# Patient Record
Sex: Male | Born: 1945 | Race: Black or African American | Hispanic: No | Marital: Single | Smoking: Former smoker
Health system: Southern US, Community
[De-identification: ages and names within clinical notes are randomized; demographics above are authoritative.]

## PROBLEM LIST (undated history)

## (undated) DIAGNOSIS — K219 Gastro-esophageal reflux disease without esophagitis: Secondary | ICD-10-CM

## (undated) DIAGNOSIS — R51 Headache: Secondary | ICD-10-CM

## (undated) DIAGNOSIS — N400 Enlarged prostate without lower urinary tract symptoms: Secondary | ICD-10-CM

## (undated) DIAGNOSIS — R569 Unspecified convulsions: Secondary | ICD-10-CM

## (undated) DIAGNOSIS — I10 Essential (primary) hypertension: Secondary | ICD-10-CM

## (undated) DIAGNOSIS — K8010 Calculus of gallbladder with chronic cholecystitis without obstruction: Secondary | ICD-10-CM

## (undated) DIAGNOSIS — F79 Unspecified intellectual disabilities: Secondary | ICD-10-CM

## (undated) DIAGNOSIS — N12 Tubulo-interstitial nephritis, not specified as acute or chronic: Secondary | ICD-10-CM

## (undated) HISTORY — DX: Gastro-esophageal reflux disease without esophagitis: K21.9

## (undated) HISTORY — DX: Benign prostatic hyperplasia without lower urinary tract symptoms: N40.0

## (undated) HISTORY — DX: Essential (primary) hypertension: I10

## (undated) HISTORY — DX: Unspecified intellectual disabilities: F79

## (undated) HISTORY — DX: Unspecified convulsions: R56.9

## (undated) HISTORY — DX: Calculus of gallbladder with chronic cholecystitis without obstruction: K80.10

## (undated) HISTORY — DX: Tubulo-interstitial nephritis, not specified as acute or chronic: N12

## (undated) HISTORY — PX: CATARACT EXTRACTION: SUR2

## (undated) HISTORY — DX: Headache: R51

---

## 2004-10-31 ENCOUNTER — Ambulatory Visit: Payer: Self-pay | Admitting: Ophthalmology

## 2004-11-06 ENCOUNTER — Ambulatory Visit: Payer: Self-pay | Admitting: Ophthalmology

## 2006-03-07 ENCOUNTER — Inpatient Hospital Stay: Payer: Self-pay | Admitting: Internal Medicine

## 2006-03-07 ENCOUNTER — Other Ambulatory Visit: Payer: Self-pay

## 2011-10-06 ENCOUNTER — Other Ambulatory Visit: Payer: Self-pay | Admitting: Geriatric Medicine

## 2011-10-06 LAB — URINALYSIS, COMPLETE
Bacteria: NONE SEEN
Blood: NEGATIVE
Glucose,UR: 150 mg/dL (ref 0–75)
Ketone: NEGATIVE
Leukocyte Esterase: NEGATIVE
Nitrite: NEGATIVE
Ph: 7 (ref 4.5–8.0)
Specific Gravity: 1.017 (ref 1.003–1.030)
Squamous Epithelial: NONE SEEN

## 2011-10-07 LAB — URINE CULTURE

## 2011-10-08 ENCOUNTER — Other Ambulatory Visit: Payer: Self-pay

## 2011-10-08 LAB — COMPREHENSIVE METABOLIC PANEL
Albumin: 2.7 g/dL — ABNORMAL LOW (ref 3.4–5.0)
Anion Gap: 11 (ref 7–16)
Calcium, Total: 8.1 mg/dL — ABNORMAL LOW (ref 8.5–10.1)
Chloride: 100 mmol/L (ref 98–107)
Co2: 26 mmol/L (ref 21–32)
EGFR (African American): >60
Glucose: 119 mg/dL — ABNORMAL HIGH (ref 65–99)
Potassium: 4.1 mmol/L (ref 3.5–5.1)
SGOT(AST): 77 U/L — ABNORMAL HIGH (ref 15–37)

## 2011-10-08 LAB — CBC WITH DIFFERENTIAL/PLATELET
Basophil #: 0.1 10*3/uL (ref 0.0–0.1)
Eosinophil #: 0.1 10*3/uL (ref 0.0–0.7)
Eosinophil %: 0.7 %
HGB: 13.2 g/dL (ref 13.0–18.0)
Lymphocyte %: 12.5 %
MCHC: 33.8 g/dL (ref 32.0–36.0)
MCV: 89 fL (ref 80–100)
Monocyte #: 1.2 10*3/uL — ABNORMAL HIGH (ref 0.0–0.7)
Neutrophil #: 8.1 10*3/uL — ABNORMAL HIGH (ref 1.4–6.5)
Neutrophil %: 75.2 %
RDW: 12.8 % (ref 11.5–14.5)
WBC: 10.7 10*3/uL — ABNORMAL HIGH (ref 3.8–10.6)

## 2011-10-09 ENCOUNTER — Other Ambulatory Visit: Payer: Self-pay | Admitting: Geriatric Medicine

## 2011-10-09 LAB — AMYLASE: Amylase: 60 U/L (ref 25–115)

## 2012-07-18 ENCOUNTER — Other Ambulatory Visit: Payer: Self-pay | Admitting: Geriatric Medicine

## 2012-07-18 LAB — URINALYSIS, COMPLETE
Bilirubin,UR: NEGATIVE
Blood: NEGATIVE
Glucose,UR: NEGATIVE mg/dL (ref 0–75)
Hyaline Cast: 4
Ketone: NEGATIVE
Protein: NEGATIVE
RBC,UR: <1 /HPF (ref 0–5)
Squamous Epithelial: NONE SEEN
WBC UR: <1 /HPF (ref 0–5)

## 2012-07-19 LAB — URINE CULTURE

## 2012-07-20 ENCOUNTER — Ambulatory Visit: Payer: Self-pay

## 2012-09-16 DIAGNOSIS — F79 Unspecified intellectual disabilities: Secondary | ICD-10-CM

## 2012-09-16 DIAGNOSIS — I10 Essential (primary) hypertension: Secondary | ICD-10-CM

## 2012-09-16 DIAGNOSIS — N400 Enlarged prostate without lower urinary tract symptoms: Secondary | ICD-10-CM

## 2012-09-16 DIAGNOSIS — K8010 Calculus of gallbladder with chronic cholecystitis without obstruction: Secondary | ICD-10-CM

## 2012-09-16 DIAGNOSIS — R51 Headache: Secondary | ICD-10-CM

## 2012-09-16 HISTORY — DX: Benign prostatic hyperplasia without lower urinary tract symptoms: N40.0

## 2012-09-16 HISTORY — DX: Calculus of gallbladder with chronic cholecystitis without obstruction: K80.10

## 2012-09-16 HISTORY — DX: Essential (primary) hypertension: I10

## 2012-09-16 HISTORY — DX: Headache: R51

## 2012-09-16 HISTORY — DX: Unspecified intellectual disabilities: F79

## 2012-11-23 ENCOUNTER — Ambulatory Visit: Payer: Self-pay | Admitting: General Surgery

## 2012-11-23 DIAGNOSIS — I10 Essential (primary) hypertension: Secondary | ICD-10-CM

## 2012-11-23 LAB — CBC WITH DIFFERENTIAL/PLATELET
Eosinophil #: 0.1 10*3/uL (ref 0.0–0.7)
Eosinophil %: 1.9 %
HCT: 45 % (ref 40.0–52.0)
Lymphocyte #: 1.9 10*3/uL (ref 1.0–3.6)
Lymphocyte %: 35.4 %
MCHC: 32.8 g/dL (ref 32.0–36.0)
MCV: 89 fL (ref 80–100)
Monocyte #: 0.8 x10 3/mm (ref 0.2–1.0)
Monocyte %: 14.7 %
Neutrophil %: 47.4 %
Platelet: 214 10*3/uL (ref 150–440)
RBC: 5.06 10*6/uL (ref 4.40–5.90)

## 2012-11-23 LAB — COMPREHENSIVE METABOLIC PANEL
Alkaline Phosphatase: 65 U/L (ref 50–136)
BUN: 20 mg/dL — ABNORMAL HIGH (ref 7–18)
Bilirubin,Total: 0.5 mg/dL (ref 0.2–1.0)
Calcium, Total: 8.7 mg/dL (ref 8.5–10.1)
Chloride: 107 mmol/L (ref 98–107)
Co2: 25 mmol/L (ref 21–32)
Creatinine: 1.21 mg/dL (ref 0.60–1.30)
EGFR (African American): >60
EGFR (Non-African Amer.): >60
Glucose: 68 mg/dL (ref 65–99)
Potassium: 4.6 mmol/L (ref 3.5–5.1)
SGOT(AST): 18 U/L (ref 15–37)
Sodium: 137 mmol/L (ref 136–145)

## 2012-12-08 ENCOUNTER — Ambulatory Visit: Payer: Self-pay | Admitting: General Surgery

## 2012-12-08 DIAGNOSIS — K8010 Calculus of gallbladder with chronic cholecystitis without obstruction: Secondary | ICD-10-CM

## 2012-12-08 HISTORY — PX: CHOLECYSTECTOMY: SHX55

## 2012-12-09 ENCOUNTER — Encounter: Payer: Self-pay | Admitting: General Surgery

## 2012-12-10 ENCOUNTER — Encounter: Payer: Self-pay | Admitting: *Deleted

## 2012-12-10 DIAGNOSIS — F79 Unspecified intellectual disabilities: Secondary | ICD-10-CM | POA: Insufficient documentation

## 2012-12-14 ENCOUNTER — Encounter: Payer: Self-pay | Admitting: General Surgery

## 2012-12-15 ENCOUNTER — Encounter: Payer: Self-pay | Admitting: General Surgery

## 2012-12-15 ENCOUNTER — Ambulatory Visit (INDEPENDENT_AMBULATORY_CARE_PROVIDER_SITE_OTHER): Payer: Medicare Other | Admitting: General Surgery

## 2012-12-15 VITALS — BP 130/72 | HR 76 | Resp 16 | Ht 68.0 in | Wt 152.0 lb

## 2012-12-15 DIAGNOSIS — Z9049 Acquired absence of other specified parts of digestive tract: Secondary | ICD-10-CM

## 2012-12-15 DIAGNOSIS — Z9089 Acquired absence of other organs: Secondary | ICD-10-CM

## 2012-12-15 NOTE — Patient Instructions (Addendum)
Patient to return as needed. 

## 2012-12-15 NOTE — Progress Notes (Signed)
Subjective:     Patient ID: Scott Peters, male   DOB: 1946-08-10, 67 y.o.   MRN: [id number]  HPI   This is a 67 year old male following up from his post op gallbladder surgery.Patient states no abdominal pain.the patient reports he has had one episode of vomiting since surgery. Cholecystectomy was completed on December 08, 2012. Review of Systems  Constitutional: Negative.   Respiratory: Negative.   Cardiovascular: Negative.   Gastrointestinal: Negative.        Objective:   Physical Exam  Constitutional: He appears well-developed and well-nourished.  Cardiovascular: Normal rate, regular rhythm and normal heart sounds.   Pulmonary/Chest: Effort normal and breath sounds normal.  Abdominal: Soft. Bowel sounds are normal.  Port site healing well.       Assessment:     Pathology showed chronic cholecystitis and cholelithiasis.     Plan:     The patient is Will follow up on an as-needed basis.

## 2012-12-16 ENCOUNTER — Encounter: Payer: Self-pay | Admitting: General Surgery

## 2013-06-01 ENCOUNTER — Ambulatory Visit: Payer: Self-pay | Admitting: Gastroenterology

## 2013-11-02 IMAGING — CR DG CHOLANGIOGRAM OPERATIVE
1 series · 3 of 3 positions shown · non-contrast
Comparison: none

REASON FOR EXAM: Cholelithiasis
COMMENTS:

PROCEDURE:     DXR - DXR CHOLANGIOGRAM OP (INITIAL)  - December 08, 2012  [DATE]
RESULT:     Comparison: None.

[Series 3: cont. · 3 of 3 frames shown]
[frame 1/3]
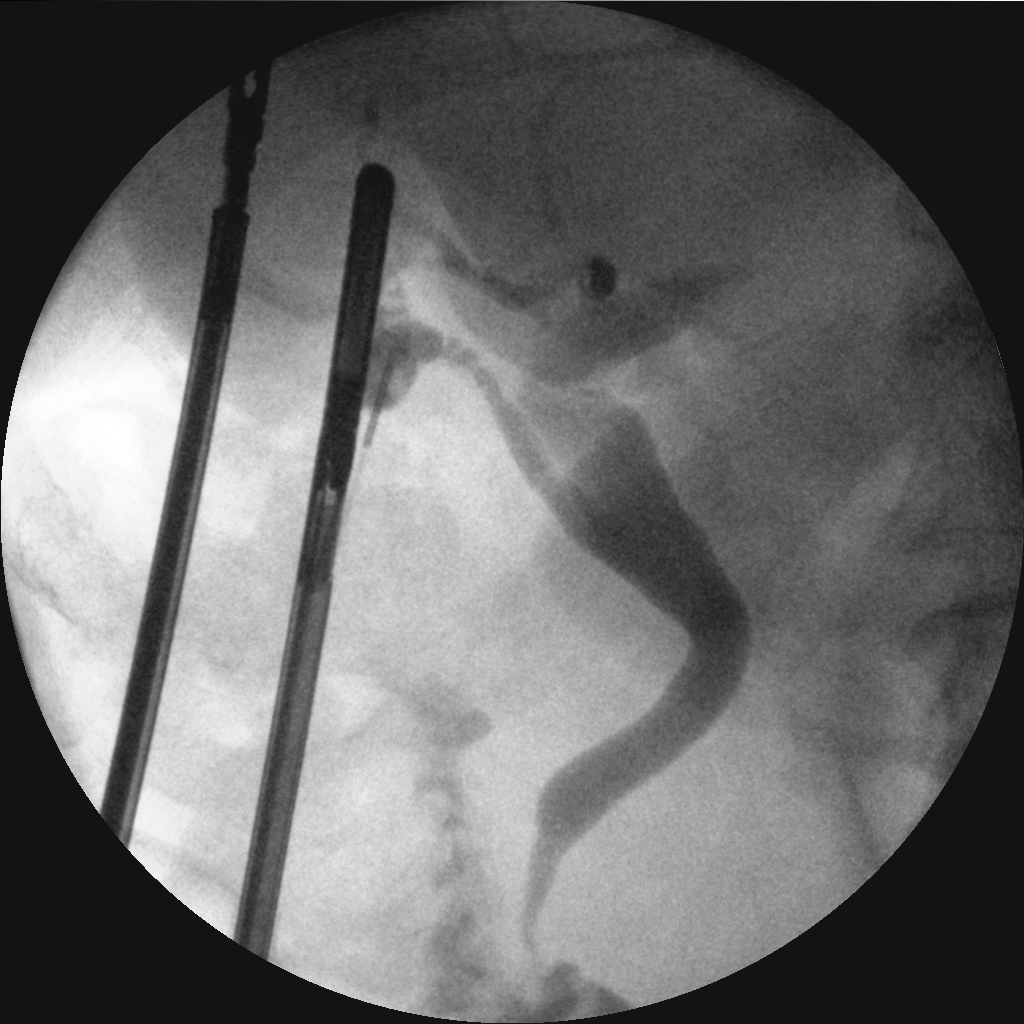
[frame 2/3]
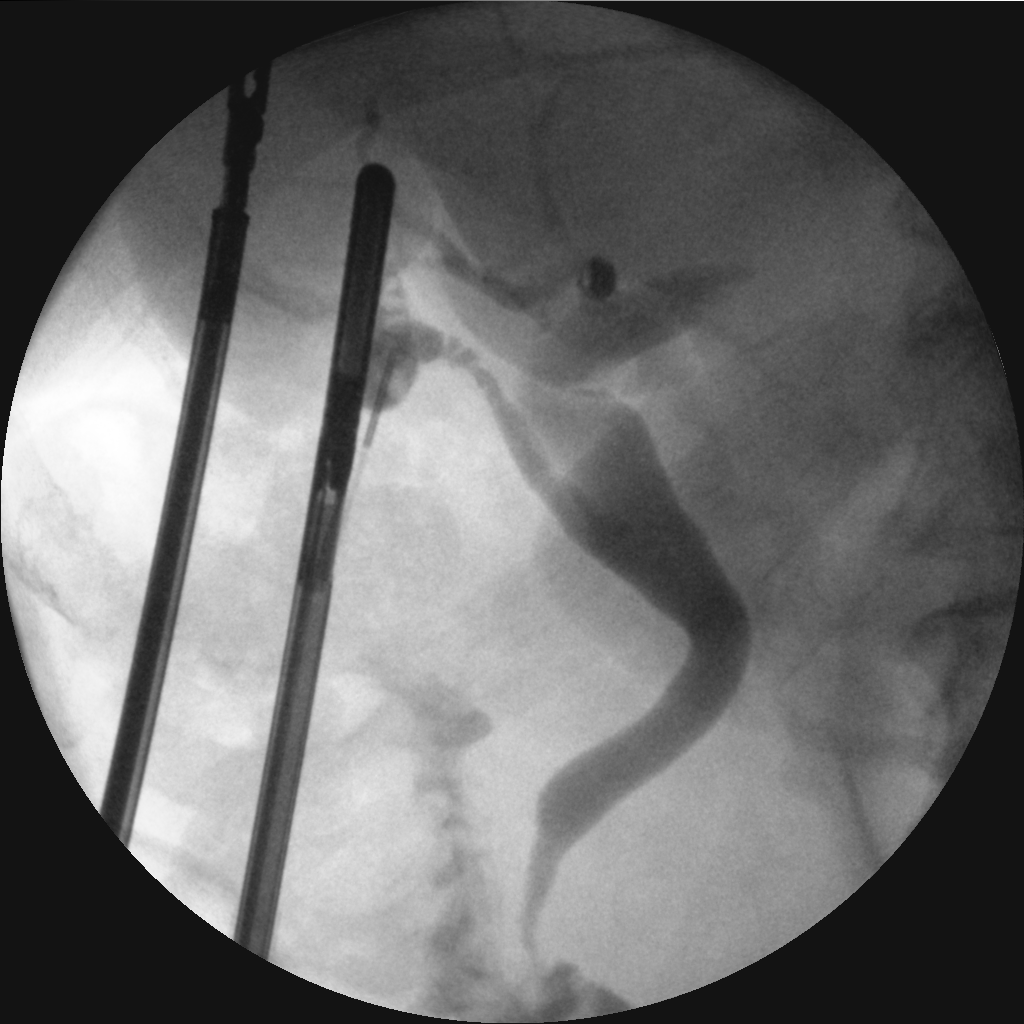
[frame 3/3]
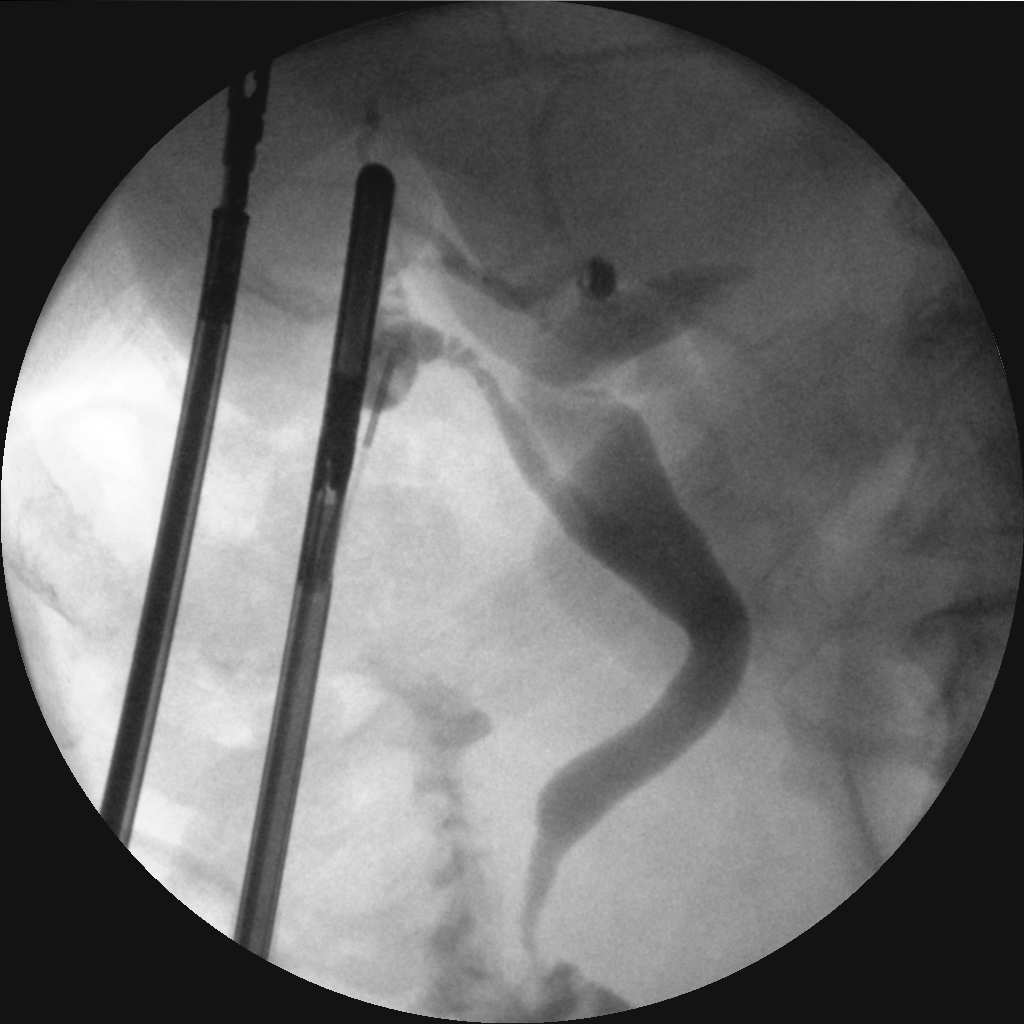

[3 of 3 positions shown; findings below may reference images not displayed]

FINDINGS: Single fluoroscopic image was submitted from intraoperative cholangiogram.
There is opacification of the cystic duct, common bile duct, central
intrahepatic biliary ducts, and duodenum. No filling defects identified.
There is smooth tapering of the distal common bile duct. The common duct is
mildly prominent in diameter. Remainder of the interpretation is deferred to
the performing clinician.
IMPRESSION: Please see above.

[REDACTED]

## 2015-01-06 NOTE — Op Note (Signed)
PATIENT NAME:  Scott Peters, Nhat T MR#:  [id number] DATE OF BIRTH:  01/19/1946  DATE OF PROCEDURE:  12/08/2012  PREOPERATIVE DIAGNOSIS: Chronic cholecystitis and cholelithiasis.   POSTOPERATIVE DIAGNOSIS: Chronic cholecystitis and cholelithiasis.  OPERATIVE PROCEDURE: Laparoscopic cholecystectomy with intraoperative cholangiograms.   OPERATING SURGEON: Donnalee CurryJeffrey Migdalia Olejniczak, MD.   ANESTHESIA: General endotracheal under Dr. Noralyn Pickarroll.   ESTIMATED BLOOD LOSS: Minimal.   CLINICAL NOTE: This 69 year old male has had episodic abdominal pain, nausea and vomiting. Ultrasound showed evidence of cholelithiasis. He was felt to be a candidate for elective cholecystectomy.   OPERATIVE NOTE: With the patient under adequate general endotracheal anesthesia, the abdomen was prepped with ChloraPrep and draped. In Trendelenburg position, a Veress needle was placed through a transumbilical incision. After assuring intra-abdominal location with the hanging drop test, the abdomen was insufflated with CO2 at 10 mmHg pressure. A 10 mm step port was expanded, and inspection showed no evidence of injury from initial port placement. The patient was placed in reverse Trendelenburg position and rolled to the left. An 11 mm Xcel port was placed in the epigastrium and two 5-mm step ports placed laterally. The gallbladder was placed on cephalad traction. Adhesions between the omentum and the gallbladder were taken down with cautery dissection. The neck of the gallbladder was cleared, and the cystic duct and cystic artery were identified. The common hepatic and common bile ducts were visualized as well. Fluoroscopic cholangiograms were completed using 15 mL of one-half strength Conray 60. This showed prompt filling of the right and left hepatic ducts and free flow into the duodenum. No evidence of retained stones. The cystic duct and cystic artery were doubly clipped and divided. The gallbladder was removed from the liver bed making use of  hook cautery dissection. It was delivered through the umbilical port site. The incision was expanded at the fascial level to allow easier delivery of the gallbladder. After re-establishing pneumoperitoneum, inspection from the epigastric site showed no evidence of injury from initial port placement. The right upper quadrant was then irrigated with lactated Ringer's solution and good hemostasis noted and adequate clip application. The abdomen was then desufflated and ports removed under direct vision. The fascia at the umbilicus was closed with an 0 Vicryl figure-of-eight suture. The incisions were closed with 4-0 Vicryl subcuticular sutures. Benzoin, Steri-Strips, Telfa and Tegaderm dressings were applied.   The patient tolerated the procedure well and was taken to the recovery room in stable condition.    ____________________________ Earline MayotteJeffrey W. Geraldy Akridge, MD jwb:gb D: 12/08/2012 22:35:26 ET T: 12/09/2012 05:07:31 ET JOB#: [id number]  cc: Earline MayotteJeffrey W. Keylin Ferryman, MD, <Dictator> Drue Flirtarol Henry-Smith, MD, [facility] Yosiah Jasmin Brion AlimentW Shunna Mikaelian MD ELECTRONICALLY SIGNED 12/09/2012 21:27

## 2015-07-04 ENCOUNTER — Other Ambulatory Visit
Admission: RE | Admit: 2015-07-04 | Discharge: 2015-07-04 | Disposition: A | Discharge status: Home / Self Care | Payer: Self-pay | Source: Skilled Nursing Facility | Attending: Family Medicine | Admitting: Family Medicine

## 2015-07-06 LAB — H. PYLORI ANTIGEN, STOOL: H. PYLORI STOOL AG, EIA: NEGATIVE

## 2016-03-13 ENCOUNTER — Other Ambulatory Visit
Admission: RE | Admit: 2016-03-13 | Discharge: 2016-03-13 | Disposition: A | Discharge status: Home / Self Care | Payer: Medicare Other | Source: Other Acute Inpatient Hospital | Attending: Family Medicine | Admitting: Family Medicine

## 2016-03-13 DIAGNOSIS — I658 Occlusion and stenosis of other precerebral arteries: Secondary | ICD-10-CM | POA: Diagnosis present

## 2016-03-13 DIAGNOSIS — I10 Essential (primary) hypertension: Secondary | ICD-10-CM | POA: Diagnosis present

## 2016-03-13 DIAGNOSIS — E538 Deficiency of other specified B group vitamins: Secondary | ICD-10-CM | POA: Diagnosis present

## 2016-03-13 DIAGNOSIS — I2510 Atherosclerotic heart disease of native coronary artery without angina pectoris: Secondary | ICD-10-CM | POA: Diagnosis present

## 2016-03-13 LAB — URINALYSIS COMPLETE WITH MICROSCOPIC (ARMC ONLY)
BILIRUBIN URINE: NEGATIVE
Glucose, UA: 50 mg/dL — AB
NITRITE: NEGATIVE
PH: 5 (ref 5.0–8.0)
PROTEIN: 100 mg/dL — AB
SQUAMOUS EPITHELIAL / LPF: NONE SEEN
Specific Gravity, Urine: 1.015 (ref 1.005–1.030)

## 2016-03-13 LAB — CBC WITH DIFFERENTIAL/PLATELET
Basophils Absolute: 0 10*3/uL (ref 0–0.1)
Basophils Relative: 0 %
EOS PCT: 0 %
Eosinophils Absolute: 0 10*3/uL (ref 0–0.7)
HCT: 40 % (ref 40.0–52.0)
HEMOGLOBIN: 13.3 g/dL (ref 13.0–18.0)
Lymphocytes Relative: 4 %
Lymphs Abs: 0.6 10*3/uL — ABNORMAL LOW (ref 1.0–3.6)
MCH: 28.7 pg (ref 26.0–34.0)
MCHC: 33.3 g/dL (ref 32.0–36.0)
MCV: 86.2 fL (ref 80.0–100.0)
MONOS PCT: 9 %
Monocytes Absolute: 1.3 10*3/uL — ABNORMAL HIGH (ref 0.2–1.0)
NEUTROS PCT: 87 %
Neutro Abs: 12.2 10*3/uL — ABNORMAL HIGH (ref 1.4–6.5)
Platelets: 228 10*3/uL (ref 150–440)
RBC: 4.64 MIL/uL (ref 4.40–5.90)
RDW: 13.9 % (ref 11.5–14.5)
WBC: 14.1 10*3/uL — AB (ref 3.8–10.6)

## 2016-03-13 LAB — BASIC METABOLIC PANEL
ANION GAP: 11 (ref 5–15)
BUN: 45 mg/dL — ABNORMAL HIGH (ref 6–20)
CO2: 22 mmol/L (ref 22–32)
CREATININE: 1.69 mg/dL — AB (ref 0.61–1.24)
Calcium: 9 mg/dL (ref 8.9–10.3)
Chloride: 110 mmol/L (ref 101–111)
GFR calc Af Amer: 45 mL/min — ABNORMAL LOW (ref >60)
GFR, EST NON AFRICAN AMERICAN: 39 mL/min — AB (ref >60)
Glucose, Bld: 142 mg/dL — ABNORMAL HIGH (ref 65–99)
POTASSIUM: 4.3 mmol/L (ref 3.5–5.1)
SODIUM: 143 mmol/L (ref 135–145)

## 2016-03-15 LAB — URINE CULTURE

## 2016-07-26 ENCOUNTER — Other Ambulatory Visit
Admission: RE | Admit: 2016-07-26 | Discharge: 2016-07-26 | Disposition: A | Discharge status: Home / Self Care | Payer: Medicare Other | Source: Ambulatory Visit | Attending: Family Medicine | Admitting: Family Medicine

## 2016-07-26 DIAGNOSIS — N400 Enlarged prostate without lower urinary tract symptoms: Secondary | ICD-10-CM | POA: Diagnosis present

## 2016-07-26 DIAGNOSIS — N110 Nonobstructive reflux-associated chronic pyelonephritis: Secondary | ICD-10-CM | POA: Diagnosis present

## 2016-07-26 DIAGNOSIS — G960 Cerebrospinal fluid leak: Secondary | ICD-10-CM | POA: Diagnosis present

## 2016-07-26 DIAGNOSIS — R52 Pain, unspecified: Secondary | ICD-10-CM | POA: Insufficient documentation

## 2016-07-26 DIAGNOSIS — F329 Major depressive disorder, single episode, unspecified: Secondary | ICD-10-CM | POA: Insufficient documentation

## 2016-07-26 DIAGNOSIS — R2689 Other abnormalities of gait and mobility: Secondary | ICD-10-CM | POA: Insufficient documentation

## 2016-07-26 DIAGNOSIS — K219 Gastro-esophageal reflux disease without esophagitis: Secondary | ICD-10-CM | POA: Diagnosis present

## 2016-07-26 DIAGNOSIS — E550 Rickets, active: Secondary | ICD-10-CM | POA: Diagnosis present

## 2016-07-26 DIAGNOSIS — K5900 Constipation, unspecified: Secondary | ICD-10-CM | POA: Insufficient documentation

## 2016-07-26 DIAGNOSIS — R51 Headache: Secondary | ICD-10-CM | POA: Insufficient documentation

## 2016-07-26 DIAGNOSIS — Z9181 History of falling: Secondary | ICD-10-CM | POA: Diagnosis present

## 2016-07-26 DIAGNOSIS — G969 Disorder of central nervous system, unspecified: Secondary | ICD-10-CM | POA: Diagnosis present

## 2016-07-26 LAB — URINALYSIS COMPLETE WITH MICROSCOPIC (ARMC ONLY)
BACTERIA UA: NONE SEEN
BILIRUBIN URINE: NEGATIVE
GLUCOSE, UA: 150 mg/dL — AB
HGB URINE DIPSTICK: NEGATIVE
Ketones, ur: NEGATIVE mg/dL
LEUKOCYTES UA: NEGATIVE
NITRITE: NEGATIVE
PH: 6 (ref 5.0–8.0)
Protein, ur: NEGATIVE mg/dL
SPECIFIC GRAVITY, URINE: 1.019 (ref 1.005–1.030)
SQUAMOUS EPITHELIAL / LPF: NONE SEEN

## 2016-07-26 LAB — CBC WITH DIFFERENTIAL/PLATELET
BASOS ABS: 0 10*3/uL (ref 0–0.1)
BASOS PCT: 1 %
Eosinophils Absolute: 0.2 10*3/uL (ref 0–0.7)
Eosinophils Relative: 3 %
HEMATOCRIT: 44.9 % (ref 40.0–52.0)
Hemoglobin: 14.9 g/dL (ref 13.0–18.0)
LYMPHS PCT: 39 %
Lymphs Abs: 2.2 10*3/uL (ref 1.0–3.6)
MCH: 29.1 pg (ref 26.0–34.0)
MCHC: 33.2 g/dL (ref 32.0–36.0)
MCV: 87.8 fL (ref 80.0–100.0)
MONO ABS: 0.8 10*3/uL (ref 0.2–1.0)
Monocytes Relative: 15 %
NEUTROS ABS: 2.5 10*3/uL (ref 1.4–6.5)
NEUTROS PCT: 42 %
Platelets: 212 10*3/uL (ref 150–440)
RBC: 5.11 MIL/uL (ref 4.40–5.90)
RDW: 13 % (ref 11.5–14.5)
WBC: 5.7 10*3/uL (ref 3.8–10.6)

## 2016-07-26 LAB — COMPREHENSIVE METABOLIC PANEL
ALBUMIN: 3.9 g/dL (ref 3.5–5.0)
ALK PHOS: 56 U/L (ref 38–126)
ALT: 16 U/L — ABNORMAL LOW (ref 17–63)
AST: 22 U/L (ref 15–41)
Anion gap: 6 (ref 5–15)
BILIRUBIN TOTAL: 0.6 mg/dL (ref 0.3–1.2)
BUN: 23 mg/dL — AB (ref 6–20)
CHLORIDE: 106 mmol/L (ref 101–111)
CO2: 27 mmol/L (ref 22–32)
CREATININE: 1.01 mg/dL (ref 0.61–1.24)
Calcium: 9.4 mg/dL (ref 8.9–10.3)
GFR calc Af Amer: >60 mL/min (ref >60)
GFR calc non Af Amer: >60 mL/min (ref >60)
GLUCOSE: 73 mg/dL (ref 65–99)
Potassium: 4.5 mmol/L (ref 3.5–5.1)
SODIUM: 139 mmol/L (ref 135–145)
Total Protein: 7.3 g/dL (ref 6.5–8.1)

## 2016-07-27 LAB — URINE CULTURE

## 2016-07-30 LAB — LEVETIRACETAM LEVEL: Levetiracetam Lvl: 23.1 ug/mL (ref 10.0–40.0)

## 2017-05-31 ENCOUNTER — Emergency Department: Payer: Medicare Other

## 2017-05-31 ENCOUNTER — Emergency Department
Admission: EM | Admit: 2017-05-31 | Discharge: 2017-05-31 | Disposition: A | Discharge status: Home / Self Care | Payer: Medicare Other | Attending: Emergency Medicine | Admitting: Emergency Medicine

## 2017-05-31 DIAGNOSIS — Y939 Activity, unspecified: Secondary | ICD-10-CM | POA: Diagnosis not present

## 2017-05-31 DIAGNOSIS — F0390 Unspecified dementia without behavioral disturbance: Secondary | ICD-10-CM | POA: Insufficient documentation

## 2017-05-31 DIAGNOSIS — S52022A Displaced fracture of olecranon process without intraarticular extension of left ulna, initial encounter for closed fracture: Secondary | ICD-10-CM | POA: Diagnosis not present

## 2017-05-31 DIAGNOSIS — W19XXXA Unspecified fall, initial encounter: Secondary | ICD-10-CM | POA: Diagnosis not present

## 2017-05-31 DIAGNOSIS — Z79899 Other long term (current) drug therapy: Secondary | ICD-10-CM | POA: Diagnosis not present

## 2017-05-31 DIAGNOSIS — Y999 Unspecified external cause status: Secondary | ICD-10-CM | POA: Diagnosis not present

## 2017-05-31 DIAGNOSIS — Z87891 Personal history of nicotine dependence: Secondary | ICD-10-CM | POA: Diagnosis not present

## 2017-05-31 DIAGNOSIS — I10 Essential (primary) hypertension: Secondary | ICD-10-CM | POA: Diagnosis not present

## 2017-05-31 DIAGNOSIS — Y92129 Unspecified place in nursing home as the place of occurrence of the external cause: Secondary | ICD-10-CM | POA: Insufficient documentation

## 2017-05-31 DIAGNOSIS — Z7982 Long term (current) use of aspirin: Secondary | ICD-10-CM | POA: Diagnosis not present

## 2017-05-31 DIAGNOSIS — S59912A Unspecified injury of left forearm, initial encounter: Secondary | ICD-10-CM | POA: Diagnosis present

## 2017-05-31 LAB — GLUCOSE, CAPILLARY: Glucose-Capillary: 71 mg/dL (ref 65–99)

## 2017-05-31 MED ORDER — LORAZEPAM 2 MG/ML IJ SOLN
1.0000 mg | Freq: Once | INTRAMUSCULAR | Status: AC
  Administered 2017-05-31: 1 mg via INTRAMUSCULAR

## 2017-05-31 MED ORDER — LORAZEPAM 2 MG/ML IJ SOLN
INTRAMUSCULAR | Status: AC
  Administered 2017-05-31: 1 mg via INTRAMUSCULAR
  Filled 2017-05-31: qty 1

## 2017-05-31 NOTE — ED Notes (Signed)
Upon attempt to place splint, pt verbally and physically aggressive, cussing, swinging and kicking staff.  MD notified.  See new orders.

## 2017-05-31 NOTE — ED Triage Notes (Addendum)
Per EMS pt comes from [facility] and was indicating left arm pain.  Upon assessment this RN observed two red spots on his arm with swelling.  Pt has dementia and is not able to verbally express concerns.  Pt is stoic and content.  Pt has DNR.  Pt has a history of being combative with staff when agitated and when this happens he likes to have milk.

## 2017-05-31 NOTE — ED Provider Notes (Signed)
[facility] Emergency Department Provider Note   ____________________________________________   First MD Initiated Contact with Patient 05/31/17 1654     (approximate)  I have reviewed the triage vital signs and the nursing notes.   HISTORY  Chief Complaint Arm Injury  EM caveat: Limited by dementia and cognitive problems  HPI Scott Peters is a 71 y.o. male comes today for evaluation of his left elbow. There is noted to have swelling and bruising at the left elbow today. Unclear if the fall has occurred, but staff. Port that he may have fallen.  Patient unable to give history.  spoke with the patient's sister, Rayfield Citizen, she is his emergency contact and she reports that he is not able to walk any longer due to severe dementia weakness, he also does not talk. This is been his baseline now due to worsening of his dementia  Past Medical History:  Diagnosis Date  . BPH (benign prostatic hyperplasia) 2014  . Calculus of gallbladder with other cholecystitis, without mention of obstruction 2014  . GERD (gastroesophageal reflux disease)   . Headache(784.0) 2014  . Hypertension 2014  . 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 Pyelonephritis   . Seizures [employer])     Patient Active Problem List   Diagnosis Date Noted  . Mental retardation     Past Surgical History:  Procedure Laterality Date  . CATARACT EXTRACTION    . CHOLECYSTECTOMY  12/08/2012   laparoscopic    Prior to Admission medications   Medication Sig Start Date End Date Taking? Authorizing Provider  acetaminophen (TYLENOL) 500 MG tablet Take 500 mg by mouth 2 (two) times daily. Take 2 tablets twice a day    [provider]  aspirin 81 MG tablet Take 81 mg by mouth daily.    [provider]  Cholecalciferol (VITAMIN D3) 50000 UNITS CAPS Take 1 capsule by mouth every 30 (thirty) days.    [provider]  levETIRAcetam (KEPPRA) 500 MG tablet Take 500 mg by mouth every  12 (twelve) hours.    [provider]  metoprolol tartrate (LOPRESSOR) 25 MG tablet Take 25 mg by mouth 2 (two) times daily.    [provider]  ondansetron (ZOFRAN) 4 MG tablet Take 4 mg by mouth every 8 (eight) hours as needed for nausea.    [provider]  ranitidine (ZANTAC) 150 MG tablet Take 150 mg by mouth daily.    [provider]  vitamin B-12 (CYANOCOBALAMIN) 1000 MCG tablet Take 1,000 mcg by mouth daily.    [provider]  no anticoagulants  Allergies Patient has no known allergies.  History reviewed. No pertinent family history.  Social History Social History  Substance Use Topics  . Smoking status: Former Smoker    Packs/day: 1.00    Years: 30.00    Types: Cigarettes  . Smokeless tobacco: Never Used  . Alcohol use No     Comment: quit 2013    Review of Systems caveat  ____________________________________________   PHYSICAL EXAM:  VITAL SIGNS: ED Triage Vitals [05/31/17 1644]  Enc Vitals Group     BP (!) 165/77     Pulse Rate 77     Resp 19     Temp 97.9 F (36.6 C)     Temp Source Axillary     SpO2 99 %     Weight      Height      Head Circumference      Peak  Flow      Pain Score      Pain Loc      Pain Edu?      Excl. in GC?     Constitutional: Alert, but nonverbal.does not follow commands, and nursing home documentation reports this is his baseline Eyes: Conjunctivae are normal. Head: Atraumatic. Nose: No congestion/rhinnorhea. Mouth/Throat: Mucous membranes are moist. Neck: No stridor.  no cervical thoracic or lumbar tenderness or abnormalities. No bruising or injury noted to the back Cardiovascular: Normal rate, regular rhythm. Grossly normal heart sounds.  Good peripheral circulation. Respiratory: Normal respiratory effort.  No retractions. Lungs CTAB. Gastrointestinal: Soft and nontender. No distention. Musculoskeletal: No lower extremity tenderness nor edema.  full range of motion of the  lower extremities without pain elicited. No shortening of the legs are rotation. Does not appear to have any pain with range of motion of the hips knees or ankles. No bruising or bleeding  Positive radial pulses bilateral, normal capillary refill bilateral hands. Grips and moves both hands, but does not follow commands. Right upper extremity atraumatic with good range of motion without any joint swelling or injuries. Left upper extremity no tenderness across the shoulder or upper chest, but does have tenderness elicited with range of motion at the left elbow. Some slight bruising and minimal edema is also noted in this area without bleeding or laceration. some slight bruising. No dopplerable radial and ulnar pulses in the left upper extremity. No obvious deformity but some swelling  Neurologic:  Normal speech and language. No gross focal neurologic deficits are appreciated.  Skin:  Skin is warm, dry and intact. No rash noted. Psychiatric: Mood and affect are normal. Speech and behavior are normal.  ____________________________________________   LABS (all labs ordered are listed, but only abnormal results are displayed)  Labs Reviewed  GLUCOSE, CAPILLARY  CBG MONITORING, ED   ____________________________________________  EKG   ____________________________________________  RADIOLOGY  Dg Elbow Complete Left  Result Date: 05/31/2017 CLINICAL DATA:  Bruising and swelling to the left elbow and forearm. Initial encounter. EXAM: LEFT ELBOW - COMPLETE 3+ VIEW COMPARISON:  None. FINDINGS: There is a transverse fracture across the olecranon process of the ulna with mild distraction. No dislocation. Prominent soft tissue swelling. Elbow joint effusion. IMPRESSION: Mildly distracted olecranon fracture of the ulna. Electronically Signed   By: Marnee Spring M.D.   On: 05/31/2017 17:54   Dg Forearm Left  Result Date: 05/31/2017 CLINICAL DATA:  Fall with left forearm pain.  Initial encounter. EXAM:  LEFT FOREARM - 2 VIEW COMPARISON:  None. FINDINGS: An intraarticular olecranon fracture is identified with 3 mm distraction. No subluxation or dislocation. No other fracture noted. IMPRESSION: Intraarticular olecranon fracture with 3 mm distraction. Electronically Signed   By: Harmon Pier M.D.   On: 05/31/2017 17:53   Dg Humerus Left  Result Date: 05/31/2017 CLINICAL DATA:  Acute left arm pain following fall. Initial encounter. EXAM: LEFT HUMERUS - 2+ VIEW COMPARISON:  None. FINDINGS: An intraarticular olecranon fracture is identified with 3 mm distraction. No subluxation or dislocation. No other fracture identified. IMPRESSION: Intraarticular olecranon fracture with 3 mm distraction. Electronically Signed   By: Harmon Pier M.D.   On: 05/31/2017 17:52    ____________________________________________   PROCEDURES  Procedure(s) performed: None  Procedures  Critical Care performed: No  ____________________________________________   INITIAL IMPRESSION / ASSESSMENT AND PLAN / ED COURSE  Pertinent labs & imaging results that were available during my care of the patient were reviewed by me and  considered in my medical decision making (see chart for details).  discussed with nurse practitioner for the nursing home, [place]. Indicate they suspect the patient has fallen and edematous injury to left elbow. No other recent injury, it was unwitnessed. No recent illness.  X-ray demonstrates a minimally displaced olecranon fracture. Discussed with Dr. Loralie Champagne, he recommends posterior splint application and close outpatient follow-up with Dr. Ernest Pine. I discussed this plan with the patient's sister, Eber Jones, she is agreeable and understanding of plan to discharge him back home. No other signs of injury are noted. He appears to be his baseline mental status.   ----------------------------------------- 9:30 PM on 05/31/2017 -----------------------------------------  While placing splint, patient  became combative, yelling violently and kicking at staff. Have given him Ativan and monitoring closely. Once the patient is calm plan to reattempt splint application. Plan is to discharge thereafter. Ongoing care with plan for discharge after splint application assigned to Dr. Roxan Hockey.      ____________________________________________   FINAL CLINICAL IMPRESSION(S) / ED DIAGNOSES  Final diagnoses:  Fall  Closed fracture of olecranon process of left ulna, initial encounter      NEW MEDICATIONS STARTED DURING THIS VISIT:  New Prescriptions   No medications on file     Note:  This document was prepared using Dragon voice recognition software and may include unintentional dictation errors.     Sharyn Creamer, MD 05/31/17 2130

## 2017-05-31 NOTE — ED Notes (Signed)
Pt unable to sign for discharge due to advanced dementia.  Pt transported back to [facility] via EMS at this time.

## 2017-05-31 NOTE — ED Notes (Signed)
Soft mittens placed on pt at this time.

## 2017-05-31 NOTE — ED Notes (Signed)
Secretary notified to set up EMS transportation back to [facility].

## 2017-05-31 NOTE — ED Notes (Signed)
Pt incredibly agitated and physically violent when you interact with pt. EDT's Gladis Riffle and RN Morrie Sheldon and Danelle Earthly present. MD notified

## 2018-04-30 ENCOUNTER — Observation Stay
Admission: EM | Admit: 2018-04-30 | Discharge: 2018-05-01 | Disposition: A | Discharge status: Skilled Nursing Facility | Payer: Medicare Other | Attending: Internal Medicine | Admitting: Internal Medicine

## 2018-04-30 ENCOUNTER — Emergency Department: Payer: Medicare Other

## 2018-04-30 ENCOUNTER — Other Ambulatory Visit: Payer: Self-pay

## 2018-04-30 ENCOUNTER — Encounter: Payer: Self-pay | Admitting: Emergency Medicine

## 2018-04-30 DIAGNOSIS — F79 Unspecified intellectual disabilities: Secondary | ICD-10-CM | POA: Insufficient documentation

## 2018-04-30 DIAGNOSIS — R569 Unspecified convulsions: Secondary | ICD-10-CM | POA: Insufficient documentation

## 2018-04-30 DIAGNOSIS — K449 Diaphragmatic hernia without obstruction or gangrene: Secondary | ICD-10-CM | POA: Insufficient documentation

## 2018-04-30 DIAGNOSIS — Z87891 Personal history of nicotine dependence: Secondary | ICD-10-CM | POA: Insufficient documentation

## 2018-04-30 DIAGNOSIS — R079 Chest pain, unspecified: Secondary | ICD-10-CM | POA: Diagnosis present

## 2018-04-30 DIAGNOSIS — R0789 Other chest pain: Secondary | ICD-10-CM | POA: Diagnosis not present

## 2018-04-30 DIAGNOSIS — F0390 Unspecified dementia without behavioral disturbance: Secondary | ICD-10-CM | POA: Insufficient documentation

## 2018-04-30 DIAGNOSIS — N400 Enlarged prostate without lower urinary tract symptoms: Secondary | ICD-10-CM | POA: Diagnosis not present

## 2018-04-30 DIAGNOSIS — I959 Hypotension, unspecified: Secondary | ICD-10-CM | POA: Insufficient documentation

## 2018-04-30 DIAGNOSIS — Z7982 Long term (current) use of aspirin: Secondary | ICD-10-CM | POA: Diagnosis not present

## 2018-04-30 DIAGNOSIS — Z66 Do not resuscitate: Secondary | ICD-10-CM | POA: Insufficient documentation

## 2018-04-30 DIAGNOSIS — Z79899 Other long term (current) drug therapy: Secondary | ICD-10-CM | POA: Diagnosis not present

## 2018-04-30 DIAGNOSIS — I700 Atherosclerosis of aorta: Secondary | ICD-10-CM | POA: Diagnosis not present

## 2018-04-30 DIAGNOSIS — K219 Gastro-esophageal reflux disease without esophagitis: Secondary | ICD-10-CM | POA: Diagnosis not present

## 2018-04-30 DIAGNOSIS — I10 Essential (primary) hypertension: Secondary | ICD-10-CM | POA: Insufficient documentation

## 2018-04-30 DIAGNOSIS — E860 Dehydration: Secondary | ICD-10-CM | POA: Insufficient documentation

## 2018-04-30 LAB — URINALYSIS, COMPLETE (UACMP) WITH MICROSCOPIC
BACTERIA UA: NONE SEEN
Bilirubin Urine: NEGATIVE
Glucose, UA: >=500 mg/dL — AB
Hgb urine dipstick: NEGATIVE
Ketones, ur: NEGATIVE mg/dL
NITRITE: POSITIVE — AB
PROTEIN: NEGATIVE mg/dL
SPECIFIC GRAVITY, URINE: 1.01 (ref 1.005–1.030)
SQUAMOUS EPITHELIAL / LPF: NONE SEEN (ref 0–5)
WBC, UA: >50 WBC/hpf — ABNORMAL HIGH (ref 0–5)
pH: 6 (ref 5.0–8.0)

## 2018-04-30 LAB — CBC
HCT: 41 % (ref 40.0–52.0)
Hemoglobin: 14 g/dL (ref 13.0–18.0)
MCH: 30.4 pg (ref 26.0–34.0)
MCHC: 34 g/dL (ref 32.0–36.0)
MCV: 89.3 fL (ref 80.0–100.0)
PLATELETS: 256 10*3/uL (ref 150–440)
RBC: 4.6 MIL/uL (ref 4.40–5.90)
RDW: 13 % (ref 11.5–14.5)
WBC: 7.6 10*3/uL (ref 3.8–10.6)

## 2018-04-30 LAB — BASIC METABOLIC PANEL
Anion gap: 8 (ref 5–15)
BUN: 24 mg/dL — AB (ref 8–23)
CALCIUM: 8.9 mg/dL (ref 8.9–10.3)
CO2: 25 mmol/L (ref 22–32)
CREATININE: 0.98 mg/dL (ref 0.61–1.24)
Chloride: 106 mmol/L (ref 98–111)
GFR calc non Af Amer: >60 mL/min (ref >60)
Glucose, Bld: 123 mg/dL — ABNORMAL HIGH (ref 70–99)
Potassium: 3.9 mmol/L (ref 3.5–5.1)
SODIUM: 139 mmol/L (ref 135–145)

## 2018-04-30 LAB — TROPONIN I
TROPONIN I: 0.03 ng/mL — AB (ref <0.03)
TROPONIN I: 0.04 ng/mL — AB (ref <0.03)

## 2018-04-30 MED ORDER — TAMSULOSIN HCL 0.4 MG PO CAPS
0.4000 mg | ORAL_CAPSULE | Freq: Every day | ORAL | Status: DC
  Administered 2018-04-30: 0.4 mg via ORAL
  Filled 2018-04-30: qty 1

## 2018-04-30 MED ORDER — VITAMIN B-12 1000 MCG PO TABS
1000.0000 ug | ORAL_TABLET | Freq: Every day | ORAL | Status: DC
  Administered 2018-05-01: 1000 ug via ORAL
  Filled 2018-04-30: qty 1

## 2018-04-30 MED ORDER — SODIUM CHLORIDE 0.9 % IV SOLN
1.0000 g | INTRAVENOUS | Status: DC
  Administered 2018-04-30: 1 g via INTRAVENOUS
  Filled 2018-04-30: qty 10
  Filled 2018-04-30: qty 1

## 2018-04-30 MED ORDER — SODIUM CHLORIDE 0.9 % IV SOLN: INTRAVENOUS | Status: AC

## 2018-04-30 MED ORDER — ENOXAPARIN SODIUM 40 MG/0.4ML ~~LOC~~ SOLN
40.0000 mg | SUBCUTANEOUS | Status: DC
  Administered 2018-04-30: 40 mg via SUBCUTANEOUS
  Filled 2018-04-30: qty 0.4

## 2018-04-30 MED ORDER — ATORVASTATIN CALCIUM 20 MG PO TABS
40.0000 mg | ORAL_TABLET | Freq: Every day | ORAL | Status: DC
  Filled 2018-04-30: qty 2

## 2018-04-30 MED ORDER — VITAMIN D3 25 MCG (1000 UNIT) PO TABS
2000.0000 [IU] | ORAL_TABLET | Freq: Every day | ORAL | Status: DC
  Administered 2018-05-01: 2000 [IU] via ORAL
  Filled 2018-04-30: qty 2

## 2018-04-30 MED ORDER — NITROGLYCERIN 0.4 MG SL SUBL: 0.4000 mg | SUBLINGUAL_TABLET | SUBLINGUAL | Status: DC | PRN

## 2018-04-30 MED ORDER — GI COCKTAIL ~~LOC~~
30.0000 mL | Freq: Four times a day (QID) | ORAL | Status: DC | PRN
  Filled 2018-04-30: qty 30

## 2018-04-30 MED ORDER — SODIUM CHLORIDE 0.9 % IV SOLN
Freq: Once | INTRAVENOUS | Status: AC
  Administered 2018-04-30: 1000 mL via INTRAVENOUS

## 2018-04-30 MED ORDER — LEVETIRACETAM 750 MG PO TABS
750.0000 mg | ORAL_TABLET | Freq: Two times a day (BID) | ORAL | Status: DC
  Administered 2018-04-30: 750 mg via ORAL
  Filled 2018-04-30 (×3): qty 1

## 2018-04-30 MED ORDER — MORPHINE SULFATE (PF) 2 MG/ML IV SOLN
2.0000 mg | INTRAVENOUS | Status: DC | PRN
Start: 2018-04-30 — End: 2018-05-01

## 2018-04-30 MED ORDER — ZOLPIDEM TARTRATE 5 MG PO TABS: 5.0000 mg | ORAL_TABLET | Freq: Every evening | ORAL | Status: DC | PRN

## 2018-04-30 MED ORDER — LORAZEPAM 0.5 MG PO TABS
0.5000 mg | ORAL_TABLET | Freq: Two times a day (BID) | ORAL | Status: DC
  Administered 2018-04-30: 0.5 mg via ORAL
  Filled 2018-04-30: qty 1

## 2018-04-30 MED ORDER — SODIUM CHLORIDE 0.9 % IV SOLN
INTRAVENOUS | Status: AC
  Administered 2018-04-30: 22:00:00 via INTRAVENOUS

## 2018-04-30 MED ORDER — ALPRAZOLAM 0.5 MG PO TABS
0.2500 mg | ORAL_TABLET | Freq: Two times a day (BID) | ORAL | Status: DC | PRN
  Administered 2018-04-30 – 2018-05-01 (×2): 0.25 mg via ORAL
  Filled 2018-04-30 (×2): qty 1

## 2018-04-30 MED ORDER — ASPIRIN EC 325 MG PO TBEC
325.0000 mg | DELAYED_RELEASE_TABLET | Freq: Every day | ORAL | Status: DC
  Administered 2018-05-01: 325 mg via ORAL
  Filled 2018-04-30: qty 1

## 2018-04-30 MED ORDER — ONDANSETRON HCL 4 MG/2ML IJ SOLN: 4.0000 mg | Freq: Four times a day (QID) | INTRAMUSCULAR | Status: DC | PRN

## 2018-04-30 MED ORDER — SODIUM CHLORIDE 0.9 % IV SOLN
1000.0000 mL | Freq: Once | INTRAVENOUS | Status: AC
  Administered 2018-04-30: 1000 mL via INTRAVENOUS

## 2018-04-30 MED ORDER — MIRTAZAPINE 15 MG PO TABS
7.5000 mg | ORAL_TABLET | Freq: Every day | ORAL | Status: DC
  Administered 2018-04-30: 7.5 mg via ORAL
  Filled 2018-04-30: qty 1

## 2018-04-30 MED ORDER — ACETAMINOPHEN 325 MG PO TABS: 650.0000 mg | ORAL_TABLET | ORAL | Status: DC | PRN

## 2018-04-30 NOTE — ED Notes (Addendum)
Pt wet, pericare provided and linens changed.   Pt aggressive when trying to change linens.

## 2018-04-30 NOTE — ED Notes (Signed)
Patient transported to X-ray 

## 2018-04-30 NOTE — Progress Notes (Signed)
Md notified. Pts u/a is positive. I will await any new orders. I will continue to assess.

## 2018-04-30 NOTE — ED Notes (Addendum)
Attempt to call report X 1 unsuccessful.  

## 2018-04-30 NOTE — H&P (Signed)
[facility] [place] at [facility]   PATIENT NAME: Scott Peters    MR#:  [id number]  DATE OF BIRTH:  23-Aug-1946  DATE OF ADMISSION:  04/30/2018  PRIMARY CARE PHYSICIAN: Drue FlirtHenry-Smith, Carol, MD   REQUESTING/REFERRING PHYSICIAN: Dr. Cyril LoosenKinner  CHIEF COMPLAINT:   Chief Complaint  Patient presents with  . Chest Pain   Chest pain today. HISTORY OF PRESENT ILLNESS:  Scott Peters  is a 72 y.o. male with a known history of multiple medical problems as below.  The patient was sent from nursing home to ED due to above chief complaints.  The patient is demented and has mental retardation.  According to Dr. Cyril LoosenKinner, the patient was sent due to chest pain.  First troponin level is normal.  The patient was given 1 dose of nitroglycerin by EMS.  He was found hypotension at 75/78.  He was given normal saline bolus, blood pressure is better.  Continue request admission for chest pain.  PAST MEDICAL HISTORY:   Past Medical History:  Diagnosis Date  . BPH (benign prostatic hyperplasia) 2014  . Calculus of gallbladder with other cholecystitis, without mention of obstruction 2014  . GERD (gastroesophageal reflux disease)   . Headache(784.0) 2014  . Hypertension 2014  . 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 Pyelonephritis   . Seizures (HCC)     PAST SURGICAL HISTORY:   Past Surgical History:  Procedure Laterality Date  . CATARACT EXTRACTION    . CHOLECYSTECTOMY  12/08/2012   laparoscopic    SOCIAL HISTORY:   Social History   Tobacco Use  . Smoking status: Former Smoker    Packs/day: 1.00    Years: 30.00    Pack years: 30.00    Types: Cigarettes  . Smokeless tobacco: Never Used  Substance Use Topics  . Alcohol use: No    Comment: quit 2013    FAMILY HISTORY:  No family history on file.  DRUG ALLERGIES:  No Known Allergies  REVIEW OF SYSTEMS:   Review of Systems  Unable to perform ROS: Dementia    MEDICATIONS AT HOME:   Prior to Admission medications    Medication Sig Start Date End Date Taking? Authorizing Provider  acetaminophen (TYLENOL) 500 MG tablet Take 1,000 mg by mouth 2 (two) times daily.    Yes [provider]  cholecalciferol (VITAMIN D) 1000 units tablet Take 2,000 Units by mouth daily.    Yes [provider]  levETIRAcetam (KEPPRA) 750 MG tablet Take 750 mg by mouth 2 (two) times daily.    Yes [provider]  LORazepam (ATIVAN) 0.5 MG tablet Take 0.5 mg by mouth 2 (two) times daily.   Yes [provider]  mirtazapine (REMERON) 7.5 MG tablet Take 7.5 mg by mouth at bedtime.   Yes [provider]  tamsulosin (FLOMAX) 0.4 MG CAPS capsule Take 0.4 mg by mouth at bedtime.   Yes [provider]  vitamin B-12 (CYANOCOBALAMIN) 1000 MCG tablet Take 1,000 mcg by mouth daily.   Yes [provider]      VITAL SIGNS:  Blood pressure (!) 139/96, pulse 80, temperature 97.7 F (36.5 C), temperature source Oral, resp. rate 14, SpO2 100 %.  PHYSICAL EXAMINATION:  Physical Exam  GENERAL:  72 y.o.-year-old patient lying in the bed with no acute distress.  EYES: Pupils equal, round, reactive to light and accommodation. No scleral icterus. Extraocular muscles intact.  HEENT: Head atraumatic, normocephalic.  NECK:  Supple, no jugular venous distention. No  thyroid enlargement, no tenderness.  LUNGS: Normal breath sounds bilaterally, no wheezing, rales,rhonchi or crepitation. No use of accessory muscles of respiration.  CARDIOVASCULAR: S1, S2 normal. No murmurs, rubs, or gallops.  ABDOMEN: Soft, nontender, nondistended. Bowel sounds present. No organomegaly or mass.  EXTREMITIES: No pedal edema, cyanosis, or clubbing.  NEUROLOGIC: Unable to exam. PSYCHIATRIC: The patient is demented and agitated. SKIN: No obvious rash, lesion, or ulcer.   LABORATORY PANEL:   CBC Recent Labs  Lab 04/30/18 1215  WBC 7.6  HGB 14.0  HCT 41.0  PLT 256    ------------------------------------------------------------------------------------------------------------------  Chemistries  Recent Labs  Lab 04/30/18 1215  NA 139  K 3.9  CL 106  CO2 25  GLUCOSE 123*  BUN 24*  CREATININE 0.98  CALCIUM 8.9   ------------------------------------------------------------------------------------------------------------------  Cardiac Enzymes Recent Labs  Lab 04/30/18 1215  TROPONINI 0.03*   ------------------------------------------------------------------------------------------------------------------  RADIOLOGY:  Dg Chest 2 View  Result Date: 04/30/2018 CLINICAL DATA:  Chest pain EXAM: CHEST - 2 VIEW COMPARISON:  November 23, 2012 FINDINGS: There is slight atelectasis in the left base. There is no evident edema or consolidation. Heart size and pulmonary vascularity are normal. No adenopathy. There is a moderate hiatal hernia. There is aortic atherosclerosis. No pneumothorax. Bones are osteoporotic. IMPRESSION: No edema or consolidation. Slight left base atelectatic change. Stable cardiac silhouette. Moderate hiatal hernia. Aortic atherosclerosis present. Bones osteoporotic. Aortic Atherosclerosis (ICD10-I70.0). Electronically Signed   By: Bretta BangWilliam  Woodruff III M.D.   On: 04/30/2018 13:35      IMPRESSION AND PLAN:   Chest pain. Patient will be placed for observation. Start aspirin and Lipitor, nitroglycerin as needed, follow-up troponin level, lipid panel and stress test in a.m.  Hypotension.  The patient was treated with normal saline bolus, improved.  Dehydration.  Normal saline IV and follow-up BMP.  Dementia.  Continue home medication.  All the records are reviewed and case discussed with ED provider. Management plans discussed with the patient, family and they are in agreement.  CODE STATUS: DNR.  TOTAL TIME TAKING CARE OF THIS PATIENT: 38 minutes.    Shaune PollackQing Shanese Riemenschneider M.D on 04/30/2018 at 2:18 PM  Between 7am to 6pm - Pager -  [phone]  After 6pm go to [email or web address] - Social research officer, governmentpassword [email or web address]  [facility]  Office  [phone]  CC: Primary care physician; Drue FlirtHenry-Smith, Carol, MD   Note: This dictation was prepared with Dragon dictation along with smaller phrase technology. Any transcriptional errors that result from this process are unin

## 2018-04-30 NOTE — Progress Notes (Signed)
Unable to complete admission. Pt will not respond. Unable to give any medication. Pt is combative and can be verbally abusive. I will continue to assess.

## 2018-04-30 NOTE — ED Notes (Signed)
Report to Jessica, RN

## 2018-04-30 NOTE — ED Triage Notes (Signed)
[facility].  Patient is not very verbal at this time but this is his baseline according to the facility and EMS.  Patient has alcoholic dementia.  Patient given 325 aspirin and 1 SL nitro in route.  Initial BP for EMS was 114/76, and patient now reading hypotensive at 82/50.  Patient denies any pain at this time for this RN.  No diaphoresis noted.

## 2018-04-30 NOTE — ED Notes (Signed)
Per Shanda BumpsJessica, charge RN-accepting nurse to take report in 5 minutes.

## 2018-04-30 NOTE — ED Notes (Signed)
Pt pulled out IV to right arm.  New one started to L arm.

## 2018-04-30 NOTE — ED Provider Notes (Signed)
[facility] Emergency Department Provider Note   ____________________________________________    I have reviewed the triage vital signs and the nursing notes.   HISTORY  Chief Complaint Chest Pain   History limited by severe dementia  HPI Scott Peters is a 72 y.o. male with a history of significant dementia who was sent from facility today because currently complained of chest pain this morning.  Patient does not mention any complaints at this time but very difficult to obtain any history from him.  Reportedly he was given nitroglycerin by EMS.  Past Medical History:  Diagnosis Date  . BPH (benign prostatic hyperplasia) 2014  . Calculus of gallbladder with other cholecystitis, without mention of obstruction 2014  . GERD (gastroesophageal reflux disease)   . Headache(784.0) 2014  . Hypertension 2014  . 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 Pyelonephritis   . Seizures [facility](HCC)     Patient Active Problem List   Diagnosis Date Noted  . Chest pain 04/30/2018  . Mental retardation     Past Surgical History:  Procedure Laterality Date  . CATARACT EXTRACTION    . CHOLECYSTECTOMY  12/08/2012   laparoscopic    Prior to Admission medications   Medication Sig Start Date End Date Taking? Authorizing Provider  acetaminophen (TYLENOL) 500 MG tablet Take 500 mg by mouth 2 (two) times daily. Take 2 tablets twice a day    [provider]  aspirin 81 MG tablet Take 81 mg by mouth daily.    [provider]  Cholecalciferol (VITAMIN D3) 50000 UNITS CAPS Take 1 capsule by mouth every 30 (thirty) days.    [provider]  levETIRAcetam (KEPPRA) 500 MG tablet Take 500 mg by mouth every 12 (twelve) hours.    [provider]  metoprolol tartrate (LOPRESSOR) 25 MG tablet Take 25 mg by mouth 2 (two) times daily.    [provider]  ondansetron (ZOFRAN) 4 MG tablet Take 4 mg by mouth every 8 (eight) hours as needed for  nausea.    [provider]  ranitidine (ZANTAC) 150 MG tablet Take 150 mg by mouth daily.    [provider]  vitamin B-12 (CYANOCOBALAMIN) 1000 MCG tablet Take 1,000 mcg by mouth daily.    [provider]     Allergies Patient has no known allergies.  No family history on file.  Social History Social History   Tobacco Use  . Smoking status: Former Smoker    Packs/day: 1.00    Years: 30.00    Pack years: 30.00    Types: Cigarettes  . Smokeless tobacco: Never Used  Substance Use Topics  . Alcohol use: No    Comment: quit 2013  . Drug use: No    Level 5 caveat: Unable to obtain review of Systems due to dementia     ____________________________________________   PHYSICAL EXAM:  VITAL SIGNS: ED Triage Vitals [04/30/18 1202]  Enc Vitals Group     BP (!) 82/50     Pulse Rate 92     Resp 16     Temp 97.7 F (36.5 C)     Temp Source Oral     SpO2 100 %     Weight      Height      Head Circumference      Peak Flow      Pain Score      Pain Loc      Pain Edu?  Excl. in GC?     Constitutional: Alert Eyes: Conjunctivae are normal.   Mouth/Throat: Mucous membranes are dry  Cardiovascular: Normal rate, regular rhythm. Grossly normal heart sounds.  Good peripheral circulation. Respiratory: Normal respiratory effort.  No retractions. Lungs CTAB. Gastrointestinal: Soft and nontender. No distention.   Genitourinary: deferred Musculoskeletal: No lower extremity tenderness nor edema.  Warm and well perfused Neurologic:   No gross focal neurologic deficits are appreciated.  Skin:  Skin is warm, dry and intact. No rash noted.   ____________________________________________   LABS (all labs ordered are listed, but only abnormal results are displayed)  Labs Reviewed  BASIC METABOLIC PANEL - Abnormal; Notable for the following components:      Result Value   Glucose, Bld 123 (*)    BUN 24 (*)    All other components within normal  limits  TROPONIN I - Abnormal; Notable for the following components:   Troponin I 0.03 (*)    All other components within normal limits  CBC  URINALYSIS, COMPLETE (UACMP) WITH MICROSCOPIC   ____________________________________________  EKG  ED ECG REPORT I, Jene Everyobert Melaney Tellefsen, the attending physician, personally viewed and interpreted this ECG.  Date: 04/30/2018  Rhythm: normal sinus rhythm QRS Axis: normal Intervals: Prolonged QTC ST/T Wave abnormalities: normal   ____________________________________________  RADIOLOGY  Chest x-ray negative for pneumonia ____________________________________________   PROCEDURES  Procedure(s) performed: No  Procedures   Critical Care performed: No ____________________________________________   INITIAL IMPRESSION / ASSESSMENT AND PLAN / ED COURSE  Pertinent labs & imaging results that were available during my care of the patient were reviewed by me and considered in my medical decision making (see chart for details).  Patient presents with reports of chest pain complaints earlier today.  History is significantly limited by dementia.  Blood pressure is low in the emergency department, unclear whether this is related to the nitroglycerin given by EMS we will give IV fluids, check labs and reevaluate  Little improvement in blood pressure after fluids, lab work is overall reassuring, troponin  0.03  Given continued hypotension/borderline blood pressure and reports of chest pain this morning will admit to the hospital service    ____________________________________________   FINAL CLINICAL IMPRESSION(S) / ED DIAGNOSES  Final diagnoses:  Chest pain, unspecified type  Hypotension, unspecified hypotension type        Note:  This document was prepared using Dragon voice recognition software and may include unintentional dictation errors.    Jene EveryKinner, Denee Boeder, MD 04/30/18 [phone]

## 2018-05-01 ENCOUNTER — Observation Stay (HOSPITAL_BASED_OUTPATIENT_CLINIC_OR_DEPARTMENT_OTHER): Payer: Medicare Other

## 2018-05-01 DIAGNOSIS — R079 Chest pain, unspecified: Secondary | ICD-10-CM

## 2018-05-01 DIAGNOSIS — R0789 Other chest pain: Secondary | ICD-10-CM | POA: Diagnosis not present

## 2018-05-01 LAB — LIPID PANEL
CHOL/HDL RATIO: 3.3 ratio
Cholesterol: 171 mg/dL (ref 0–200)
HDL: 52 mg/dL (ref >40)
LDL Cholesterol: 106 mg/dL — ABNORMAL HIGH (ref 0–99)
Triglycerides: 63 mg/dL (ref <150)
VLDL: 13 mg/dL (ref 0–40)

## 2018-05-01 LAB — NM MYOCAR MULTI W/SPECT W/WALL MOTION / EF
CSEPPHR: 100 {beats}/min
LV sys vol: 13 mL
LVDIAVOL: 26 mL (ref 62–150)
NUC STRESS TID: 0.65
Percent HR: 68 %
Rest HR: 71 {beats}/min
SDS: 0
SRS: 0
SSS: 1

## 2018-05-01 LAB — BASIC METABOLIC PANEL
Anion gap: 7 (ref 5–15)
BUN: 16 mg/dL (ref 8–23)
CALCIUM: 8.6 mg/dL — AB (ref 8.9–10.3)
CO2: 24 mmol/L (ref 22–32)
CREATININE: 0.91 mg/dL (ref 0.61–1.24)
Chloride: 109 mmol/L (ref 98–111)
GFR calc Af Amer: >60 mL/min (ref >60)
Glucose, Bld: 100 mg/dL — ABNORMAL HIGH (ref 70–99)
Potassium: 4 mmol/L (ref 3.5–5.1)
SODIUM: 140 mmol/L (ref 135–145)

## 2018-05-01 LAB — TROPONIN I: TROPONIN I: 0.04 ng/mL — AB (ref <0.03)

## 2018-05-01 LAB — URINE CULTURE: Culture: NO GROWTH

## 2018-05-01 MED ORDER — TECHNETIUM TC 99M TETROFOSMIN IV KIT
32.6600 | PACK | Freq: Once | INTRAVENOUS | Status: AC | PRN
  Administered 2018-05-01: 32.66 via INTRAVENOUS

## 2018-05-01 MED ORDER — TECHNETIUM TC 99M TETROFOSMIN IV KIT
13.0000 | PACK | Freq: Once | INTRAVENOUS | Status: AC | PRN
  Administered 2018-05-01: 12.26 via INTRAVENOUS

## 2018-05-01 MED ORDER — REGADENOSON 0.4 MG/5ML IV SOLN
0.4000 mg | Freq: Once | INTRAVENOUS | Status: AC
  Administered 2018-05-01: 0.4 mg via INTRAVENOUS
  Filled 2018-05-01: qty 5

## 2018-05-01 MED ORDER — MIDODRINE HCL 5 MG PO TABS: 5.0000 mg | ORAL_TABLET | Freq: Three times a day (TID) | ORAL | Status: DC

## 2018-05-01 NOTE — Discharge Instructions (Signed)

## 2018-05-01 NOTE — Clinical Social Work Note (Signed)
Clinical Social Work Assessment  Patient Details  Name: Scott Peters T Ramp MRN: [id number] Date of Birth: Jan 17, 1946  Date of referral:  05/01/18               Reason for consult:  Discharge Planning                Permission sought to share information with:    Permission granted to share information::     Name::        Agency::     Relationship::     Contact Information:     Housing/Transportation Living arrangements for the past 2 months:  Skilled Nursing Facility Source of Information:  Siblings Patient Interpreter Needed:  None Criminal Activity/Legal Involvement Pertinent to Current Situation/Hospitalization:  No - Comment as needed Significant Relationships:  Siblings Lives with:    Do you feel safe going back to the place where you live?  Yes Need for family participation in patient care:  Yes (Comment)  Care giving concerns:  None. [facility].   Social Worker assessment / plan:  Patient admitted last evening under observation and is [facility]. CSW informed by nurse that patient can be combative and that he is intellectually disabled. CSW contacted patient's sister: Rogelia MireCarolyn Moore: [phone]: [phone]. She is aware that patient will be returning [facility]. She requested EMS transport.  Stanton KidneyDebra at [facility] is aware of patient's return and that due to being observation status, he will not have an FL2 provided. Physician did a discharge summary and [facility].  Employment status:  Disabled (Comment on whether or not currently receiving Disability) Insurance information:    PT Recommendations:    Information / Referral to community resources:     Patient/Family's Response to care:  Patient's sister appreciative of CSW assistance.  Patient/Family's Understanding of and Emotional Response to Diagnosis, Current Treatment, and Prognosis:  Patient's sister was relieved to hear her brother was  discharging.  Emotional Assessment Appearance:  Appears stated age Attitude/Demeanor/Rapport:  (sleeping at time of CSW visit) Affect (typically observed):    Orientation:  Oriented to Self Alcohol / Substance use:  Not Applicable Psych involvement (Current and /or in the community):  No (Comment)  Discharge Needs  Concerns to be addressed:  Care Coordination Readmission within the last 30 days:  No Current discharge risk:  Cognitively Impaired Barriers to Discharge:  No Barriers Identified   York SpanielMonica Limmie Schoenberg, LCSW 05/01/2018, 2:13 PM

## 2018-05-01 NOTE — Discharge Summary (Signed)
[facility] - [place] at [facility]  Scott Peters, 72 y.o., DOB 1945-12-31, MRN [id number]. Admission date: 04/30/2018 Discharge Date 05/01/2018 Primary MD Drue FlirtHenry-Smith, Carol, MD Admitting Physician Shaune PollackQing Chen, MD  Admission Diagnosis  Hypotension, unspecified hypotension type [I95.9] Chest pain, unspecified type [R07.9]  Discharge Diagnosis   Active Problems: Chest pain atypical status post stress test shows no evidence of ischemia Hypotension unclear etiology no evidence of CHF blood pressure now normal BPH GERD History of seizure History of mental retardation  Hospital Course patient 72 year old with multiple medical problems who presented from nursing facility due to complaint of chest pain.  Patient's cardiac enzymes were negative however he was noted to have low blood pressure therefore he was admitted.  His blood pressure is now normal.  Patient stress test is normal cardiac enzymes are negative.  If he does develop hypotension again need to start patient on midodrine.              Consults  None  Significant Tests:  See full reports for all details     Dg Chest 2 View  Result Date: 04/30/2018 CLINICAL DATA:  Chest pain EXAM: CHEST - 2 VIEW COMPARISON:  November 23, 2012 FINDINGS: There is slight atelectasis in the left base. There is no evident edema or consolidation. Heart size and pulmonary vascularity are normal. No adenopathy. There is a moderate hiatal hernia. There is aortic atherosclerosis. No pneumothorax. Bones are osteoporotic. IMPRESSION: No edema or consolidation. Slight left base atelectatic change. Stable cardiac silhouette. Moderate hiatal hernia. Aortic atherosclerosis present. Bones osteoporotic. Aortic Atherosclerosis (ICD10-I70.0). Electronically Signed   By: Bretta BangWilliam  Woodruff III M.D.   On: 04/30/2018 13:35   Nm Myocar Multi W/spect W/wall Motion / Ef  Result Date: 05/01/2018 Pharmacological myocardial perfusion imaging study with no  significant  ischemia Normal wall motion, EF estimated at 81% No EKG changes concerning for ischemia at peak stress or in recovery. Low risk scan Challenging test secondary to patient having difficulty laying flat. Signed, Dossie Arbourim Gollan, MD, Ph.D [facility]       Today   Subjective:   Scott Peters patient confused unable to provide any review of system Objective:   Blood pressure (!) 182/73, pulse 72, temperature (!) 97.5 F (36.4 C), temperature source Oral, resp. rate 18, height 6' (1.829 m), weight 60.8 kg, SpO2 100 %.  .  Intake/Output Summary (Last 24 hours) at 05/01/2018 1329 Last data filed at 05/01/2018 [id number] Gross per 24 hour  Intake 2755 ml  Output -  Net 2755 ml    Exam VITAL SIGNS: Blood pressure (!) 182/73, pulse 72, temperature (!) 97.5 F (36.4 C), temperature source Oral, resp. rate 18, height 6' (1.829 m), weight 60.8 kg, SpO2 100 %.  GENERAL:  72 y.o.-year-old patient lying in the bed with no acute distress.  EYES: Pupils equal, round, reactive to light and accommodation. No scleral icterus. Extraocular muscles intact.  HEENT: Head atraumatic, normocephalic. Oropharynx and nasopharynx clear.  NECK:  Supple, no jugular venous distention. No thyroid enlargement, no tenderness.  LUNGS: Normal breath sounds bilaterally, no wheezing, rales,rhonchi or crepitation. No use of accessory muscles of respiration.  CARDIOVASCULAR: S1, S2 normal. No murmurs, rubs, or gallops.  ABDOMEN: Soft, nontender, nondistended. Bowel sounds present. No organomegaly or mass.  EXTREMITIES: No pedal edema, cyanosis, or clubbing.  NEUROLOGIC: Confused moving all extremities  pSYCHIATRIC: Confused  sKIN: No obvious rash, lesion, or ulcer.   Data Review     CBC w Diff:  Lab Results  Component Value Date   WBC 7.6 04/30/2018   HGB 14.0 04/30/2018   HGB 14.7 11/23/2012   HCT 41.0 04/30/2018   HCT 45.0 11/23/2012   PLT 256 04/30/2018   PLT 214 11/23/2012   LYMPHOPCT 39 07/26/2016    LYMPHOPCT 35.4 11/23/2012   MONOPCT 15 07/26/2016   MONOPCT 14.7 11/23/2012   EOSPCT 3 07/26/2016   EOSPCT 1.9 11/23/2012   BASOPCT 1 07/26/2016   BASOPCT 0.6 11/23/2012   CMP:  Lab Results  Component Value Date   NA 140 05/01/2018   NA 137 11/23/2012   K 4.0 05/01/2018   K 4.6 11/23/2012   CL 109 05/01/2018   CL 107 11/23/2012   CO2 24 05/01/2018   CO2 25 11/23/2012   BUN 16 05/01/2018   BUN 20 (H) 11/23/2012   CREATININE 0.91 05/01/2018   CREATININE 1.21 11/23/2012   PROT 7.3 07/26/2016   PROT 7.7 11/23/2012   ALBUMIN 3.9 07/26/2016   ALBUMIN 3.5 11/23/2012   BILITOT 0.6 07/26/2016   BILITOT 0.5 11/23/2012   ALKPHOS 56 07/26/2016   ALKPHOS 65 11/23/2012   AST 22 07/26/2016   AST 18 11/23/2012   ALT 16 (L) 07/26/2016   ALT 23 11/23/2012  .  Micro Results No results found for this or any previous visit (from the past 240 hour(s)).      Code Status Orders  (From admission, onward)         Start     Ordered   04/30/18 1659  Do not attempt resuscitation (DNR)  Continuous    Question Answer Comment  In the event of cardiac or respiratory ARREST Do not call a "code blue"   In the event of cardiac or respiratory ARREST Do not perform Intubation, CPR, defibrillation or ACLS   In the event of cardiac or respiratory ARREST Use medication by any route, position, wound care, and other measures to relive pain and suffering. May use oxygen, suction and manual treatment of airway obstruction as needed for comfort.      04/30/18 1658        Code Status History    This patient has a current code status but no historical code status.            Discharge Medications   Allergies as of 05/01/2018   No Known Allergies     Medication List    TAKE these medications   acetaminophen 500 MG tablet Commonly known as:  TYLENOL Take 1,000 mg by mouth 2 (two) times daily.   cholecalciferol 1000 units tablet Commonly known as:  VITAMIN D Take 2,000 Units by  mouth daily.   levETIRAcetam 750 MG tablet Commonly known as:  KEPPRA Take 750 mg by mouth 2 (two) times daily.   LORazepam 0.5 MG tablet Commonly known as:  ATIVAN Take 0.5 mg by mouth 2 (two) times daily.   mirtazapine 7.5 MG tablet Commonly known as:  REMERON Take 7.5 mg by mouth at bedtime.   tamsulosin 0.4 MG Caps capsule Commonly known as:  FLOMAX Take 0.4 mg by mouth at bedtime.   vitamin B-12 1000 MCG tablet Commonly known as:  CYANOCOBALAMIN Take 1,000 mcg by mouth daily.          Total Time in preparing paper work, data evaluation and todays exam - 35 minutes  Auburn BilberryShreyang Mattison Stuckey M.D on 05/01/2018 at 1:29 PM [facility]   Office  [phone]

## 2018-05-01 NOTE — Progress Notes (Signed)
[facility]. All questions answered. Patient to transport in blue hospital gown. Raynald BlendSteven M Rathana Viveros Called EMS for non-emergent patient transport to the SNF at this time. Jari FavreSteven M [facility]

## 2018-05-01 NOTE — Care Management Obs Status (Signed)
MEDICARE OBSERVATION STATUS NOTIFICATION   Patient Details  Name: Scott Peters MRN: [id number] Date of Birth: [birth year]   Medicare Observation Status Notification Given:  No(admitted obs less thand 24 hours)    Chapman FitchBOWEN, Kellee Sittner T, RN 05/01/2018, 1:41 PM

## 2018-09-16 DEATH — deceased
